# Patient Record
Sex: Male | Born: 1940 | Race: White | Hispanic: No | Marital: Married | Smoking: Never smoker
Health system: Southern US, Community
[De-identification: ages and names within clinical notes are randomized; demographics above are authoritative.]

## PROBLEM LIST (undated history)

## (undated) DIAGNOSIS — I10 Essential (primary) hypertension: Secondary | ICD-10-CM

## (undated) DIAGNOSIS — R531 Weakness: Secondary | ICD-10-CM

## (undated) DIAGNOSIS — T7840XA Allergy, unspecified, initial encounter: Secondary | ICD-10-CM

## (undated) DIAGNOSIS — R238 Other skin changes: Secondary | ICD-10-CM

## (undated) DIAGNOSIS — M2550 Pain in unspecified joint: Secondary | ICD-10-CM

## (undated) DIAGNOSIS — Z8619 Personal history of other infectious and parasitic diseases: Secondary | ICD-10-CM

## (undated) DIAGNOSIS — R112 Nausea with vomiting, unspecified: Secondary | ICD-10-CM

## (undated) DIAGNOSIS — E039 Hypothyroidism, unspecified: Secondary | ICD-10-CM

## (undated) DIAGNOSIS — M1990 Unspecified osteoarthritis, unspecified site: Secondary | ICD-10-CM

## (undated) DIAGNOSIS — K219 Gastro-esophageal reflux disease without esophagitis: Secondary | ICD-10-CM

## (undated) DIAGNOSIS — N400 Enlarged prostate without lower urinary tract symptoms: Secondary | ICD-10-CM

## (undated) DIAGNOSIS — R234 Changes in skin texture: Secondary | ICD-10-CM

## (undated) DIAGNOSIS — Z9889 Other specified postprocedural states: Secondary | ICD-10-CM

## (undated) DIAGNOSIS — R351 Nocturia: Secondary | ICD-10-CM

## (undated) DIAGNOSIS — J45909 Unspecified asthma, uncomplicated: Secondary | ICD-10-CM

## (undated) DIAGNOSIS — J189 Pneumonia, unspecified organism: Secondary | ICD-10-CM

## (undated) DIAGNOSIS — G8929 Other chronic pain: Secondary | ICD-10-CM

## (undated) DIAGNOSIS — M549 Dorsalgia, unspecified: Secondary | ICD-10-CM

## (undated) DIAGNOSIS — R233 Spontaneous ecchymoses: Secondary | ICD-10-CM

## (undated) DIAGNOSIS — Z8709 Personal history of other diseases of the respiratory system: Secondary | ICD-10-CM

## (undated) DIAGNOSIS — F419 Anxiety disorder, unspecified: Secondary | ICD-10-CM

## (undated) HISTORY — PX: JOINT REPLACEMENT: SHX530

## (undated) HISTORY — PX: OTHER SURGICAL HISTORY: SHX169

## (undated) HISTORY — PX: TONSILLECTOMY: SUR1361

## (undated) HISTORY — PX: BACK SURGERY: SHX140

## (undated) HISTORY — PX: ESOPHAGOGASTRODUODENOSCOPY: SHX1529

## (undated) HISTORY — PX: HERNIA REPAIR: SHX51

---

## 2006-01-29 DIAGNOSIS — Z8619 Personal history of other infectious and parasitic diseases: Secondary | ICD-10-CM

## 2006-01-29 HISTORY — PX: SHOULDER ARTHROSCOPY: SHX128

## 2006-01-29 HISTORY — DX: Personal history of other infectious and parasitic diseases: Z86.19

## 2012-01-30 HISTORY — PX: CARPAL TUNNEL RELEASE: SHX101

## 2013-04-22 ENCOUNTER — Other Ambulatory Visit: Payer: Self-pay | Admitting: Neurosurgery

## 2013-05-25 ENCOUNTER — Encounter (HOSPITAL_COMMUNITY): Payer: Self-pay

## 2013-05-29 ENCOUNTER — Encounter (HOSPITAL_COMMUNITY)
Admission: RE | Admit: 2013-05-29 | Discharge: 2013-05-29 | Disposition: A | Discharge status: Home / Self Care | Payer: Medicare Other | Source: Ambulatory Visit | Attending: Neurosurgery | Admitting: Neurosurgery

## 2013-05-29 ENCOUNTER — Encounter (HOSPITAL_COMMUNITY): Payer: Self-pay

## 2013-05-29 DIAGNOSIS — I10 Essential (primary) hypertension: Secondary | ICD-10-CM | POA: Insufficient documentation

## 2013-05-29 DIAGNOSIS — Z01812 Encounter for preprocedural laboratory examination: Secondary | ICD-10-CM | POA: Insufficient documentation

## 2013-05-29 DIAGNOSIS — Z01818 Encounter for other preprocedural examination: Secondary | ICD-10-CM | POA: Insufficient documentation

## 2013-05-29 HISTORY — DX: Unspecified osteoarthritis, unspecified site: M19.90

## 2013-05-29 HISTORY — DX: Unspecified asthma, uncomplicated: J45.909

## 2013-05-29 HISTORY — DX: Gastro-esophageal reflux disease without esophagitis: K21.9

## 2013-05-29 HISTORY — DX: Essential (primary) hypertension: I10

## 2013-05-29 HISTORY — DX: Anxiety disorder, unspecified: F41.9

## 2013-05-29 LAB — BASIC METABOLIC PANEL
BUN: 18 mg/dL (ref 6–23)
CALCIUM: 9.3 mg/dL (ref 8.4–10.5)
CO2: 27 mEq/L (ref 19–32)
Chloride: 98 mEq/L (ref 96–112)
Creatinine, Ser: 1.41 mg/dL — ABNORMAL HIGH (ref 0.50–1.35)
GFR calc Af Amer: 55 mL/min — ABNORMAL LOW (ref >90)
GFR calc non Af Amer: 48 mL/min — ABNORMAL LOW (ref >90)
GLUCOSE: 104 mg/dL — AB (ref 70–99)
Potassium: 4.4 mEq/L (ref 3.7–5.3)
Sodium: 136 mEq/L — ABNORMAL LOW (ref 137–147)

## 2013-05-29 LAB — CBC
HCT: 40 % (ref 39.0–52.0)
HEMOGLOBIN: 13.6 g/dL (ref 13.0–17.0)
MCH: 30.2 pg (ref 26.0–34.0)
MCHC: 34 g/dL (ref 30.0–36.0)
MCV: 88.7 fL (ref 78.0–100.0)
Platelets: 348 10*3/uL (ref 150–400)
RBC: 4.51 MIL/uL (ref 4.22–5.81)
RDW: 14 % (ref 11.5–15.5)
WBC: 9.8 10*3/uL (ref 4.0–10.5)

## 2013-05-29 LAB — SURGICAL PCR SCREEN
MRSA, PCR: NEGATIVE
Staphylococcus aureus: NEGATIVE

## 2013-05-29 NOTE — Progress Notes (Signed)
Pt. Reports that had numerous studies & reviews because of SOB a couple yrs. Ago.  Pulm. Testing, sleep study, cardio review,; no identity made for diagnosis. Conclusion made that he might have anxiety; pt. Started on antianxiety med & has used 3 times per day every day since.  Pt. Denies SOB, chest symptoms today.

## 2013-05-29 NOTE — Progress Notes (Signed)
05/29/13 1327  OBSTRUCTIVE SLEEP APNEA  Have you ever been diagnosed with sleep apnea through a sleep study? Yes  If yes, do you have and use a CPAP or BPAP machine every night? 0 (tried using CPAP /w O2, not able due to nasal congestion )  Do you snore loudly (loud enough to be heard through closed doors)?  0  Do you often feel tired, fatigued, or sleepy during the daytime? 0  Has anyone observed you stop breathing during your sleep? 1  Do you have, or are you being treated for high blood pressure? 1  BMI more than 35 kg/m2? 0  Age over 74 years old? 1  Neck circumference greater than 40 cm/16 inches? 1  Gender: 1  Obstructive Sleep Apnea Score 5  Score 4 or greater  Results sent to PCP

## 2013-05-29 NOTE — Progress Notes (Signed)
Call to Dr. Garfield CorneaValasquez's office  [phone]( [phone])for fax no. To send sleep screen tool, office is currently closed, will try again on Mon. 06/01/2013

## 2013-05-29 NOTE — Pre-Procedure Instructions (Signed)
William Rich  05/29/2013   Your procedure is scheduled on:  Monday, May 11th  [facility]30 AM.  Call this number if you have problems the morning of surgery: [phone]   Remember:   Do not eat food or drink liquids after midnight.   Take these medicines the morning of surgery with A SIP OF WATER: pepcid, zyrtec, flonase, synthroid, tylenol, xopenex if needed  Stop taking aspirin, over the counter vitamins/herbal medications, NSAIDS (ibuprofen, advil, naproxen) 7 days prior to surgery   Do not wear jewelry.  Do not wear lotions, powders, or perfumes. You may wear deodorant.  Do not shave 48 hours prior to surgery. Men may shave face and neck.  Do not bring valuables to the hospital.  [name] is not responsible  for any belongings or valuables.               Contacts, dentures or bridgework may not be worn into surgery.  Leave suitcase in the car. After surgery it may be brought to your room.  For patients admitted to the hospital, discharge time is determined by your  treatment team.               Patients discharged the day of surgery will not be allowed to drive home.  Please read over the following fact sheets that you were given: Pain Booklet, Coughing and Deep Breathing, MRSA Information and Surgical Site Infection Prevention [place] - Preparing for Surgery  Before surgery, you can play an important role.  Because skin is not sterile, your skin needs to be as free of germs as possible.  You can reduce the number of germs on you skin by washing with CHG (chlorahexidine gluconate) soap before surgery.  CHG is an antiseptic cleaner which kills germs and bonds with the skin to continue killing germs even after washing.  Please DO NOT use if you have an allergy to CHG or antibacterial soaps.  If your skin becomes reddened/irritated stop using the CHG and inform your nurse when you arrive at Short Stay.  Do not shave (including legs  and underarms) for at least 48 hours prior to the first CHG shower.  You may shave your face.  Please follow these instructions carefully:   1.  Shower with CHG Soap the night before surgery and the morning of Surgery.  2.  If you choose to wash your hair, wash your hair first as usual with your normal shampoo.  3.  After you shampoo, rinse your hair and body thoroughly to remove the shampoo.  4.  Use CHG as you would any other liquid soap.  You can apply CHG directly to the skin and wash gently with scrungie or a clean washcloth.  5.  Apply the CHG Soap to your body ONLY FROM THE NECK DOWN.  Do not use on open wounds or open sores.  Avoid contact with your eyes, ears, mouth and genitals (private parts).  Wash genitals (private parts) with your normal soap.  6.  Wash thoroughly, paying special attention to the area where your surgery will be performed.  7.  Thoroughly rinse your body with warm water from the neck down.  8.  DO NOT shower/wash with your normal soap after using and rinsing off the CHG Soap.  9.  Pat yourself dry with a clean towel.            10.  Wear clean pajamas.  11.  Place clean sheets on your bed the night of your first shower and do not sleep with pets.  Day of Surgery  Do not apply any lotions/deoderants the morning of surgery.  Please wear clean clothes to the hospital/surgery center.

## 2013-05-29 NOTE — Progress Notes (Signed)
Pt. Reports that he had cardiac review prior to last surgery (carpal tunnel surgery) /w [facility]. 2D ECHO, EKG, notes requested from Roxan DieselLisa T MD office, sent faxed cover sheet /w [facility].

## 2013-06-08 ENCOUNTER — Encounter (HOSPITAL_COMMUNITY): Admission: RE | Payer: Self-pay | Source: Ambulatory Visit

## 2013-06-08 ENCOUNTER — Inpatient Hospital Stay (HOSPITAL_COMMUNITY): Admission: RE | Admit: 2013-06-08 | Payer: Medicare Other | Source: Ambulatory Visit | Admitting: Neurosurgery

## 2013-06-08 SURGERY — POSTERIOR CERVICAL FUSION/FORAMINOTOMY LEVEL 4
Anesthesia: General

## 2015-08-12 IMAGING — CR DG CHEST 2V
2 series · 2 of 2 positions shown · non-contrast
Comparison: June 01, 2010

CLINICAL DATA: Preoperative cervical region surgery; hypertension

EXAM:
CHEST  2 VIEW

[w chest pa]
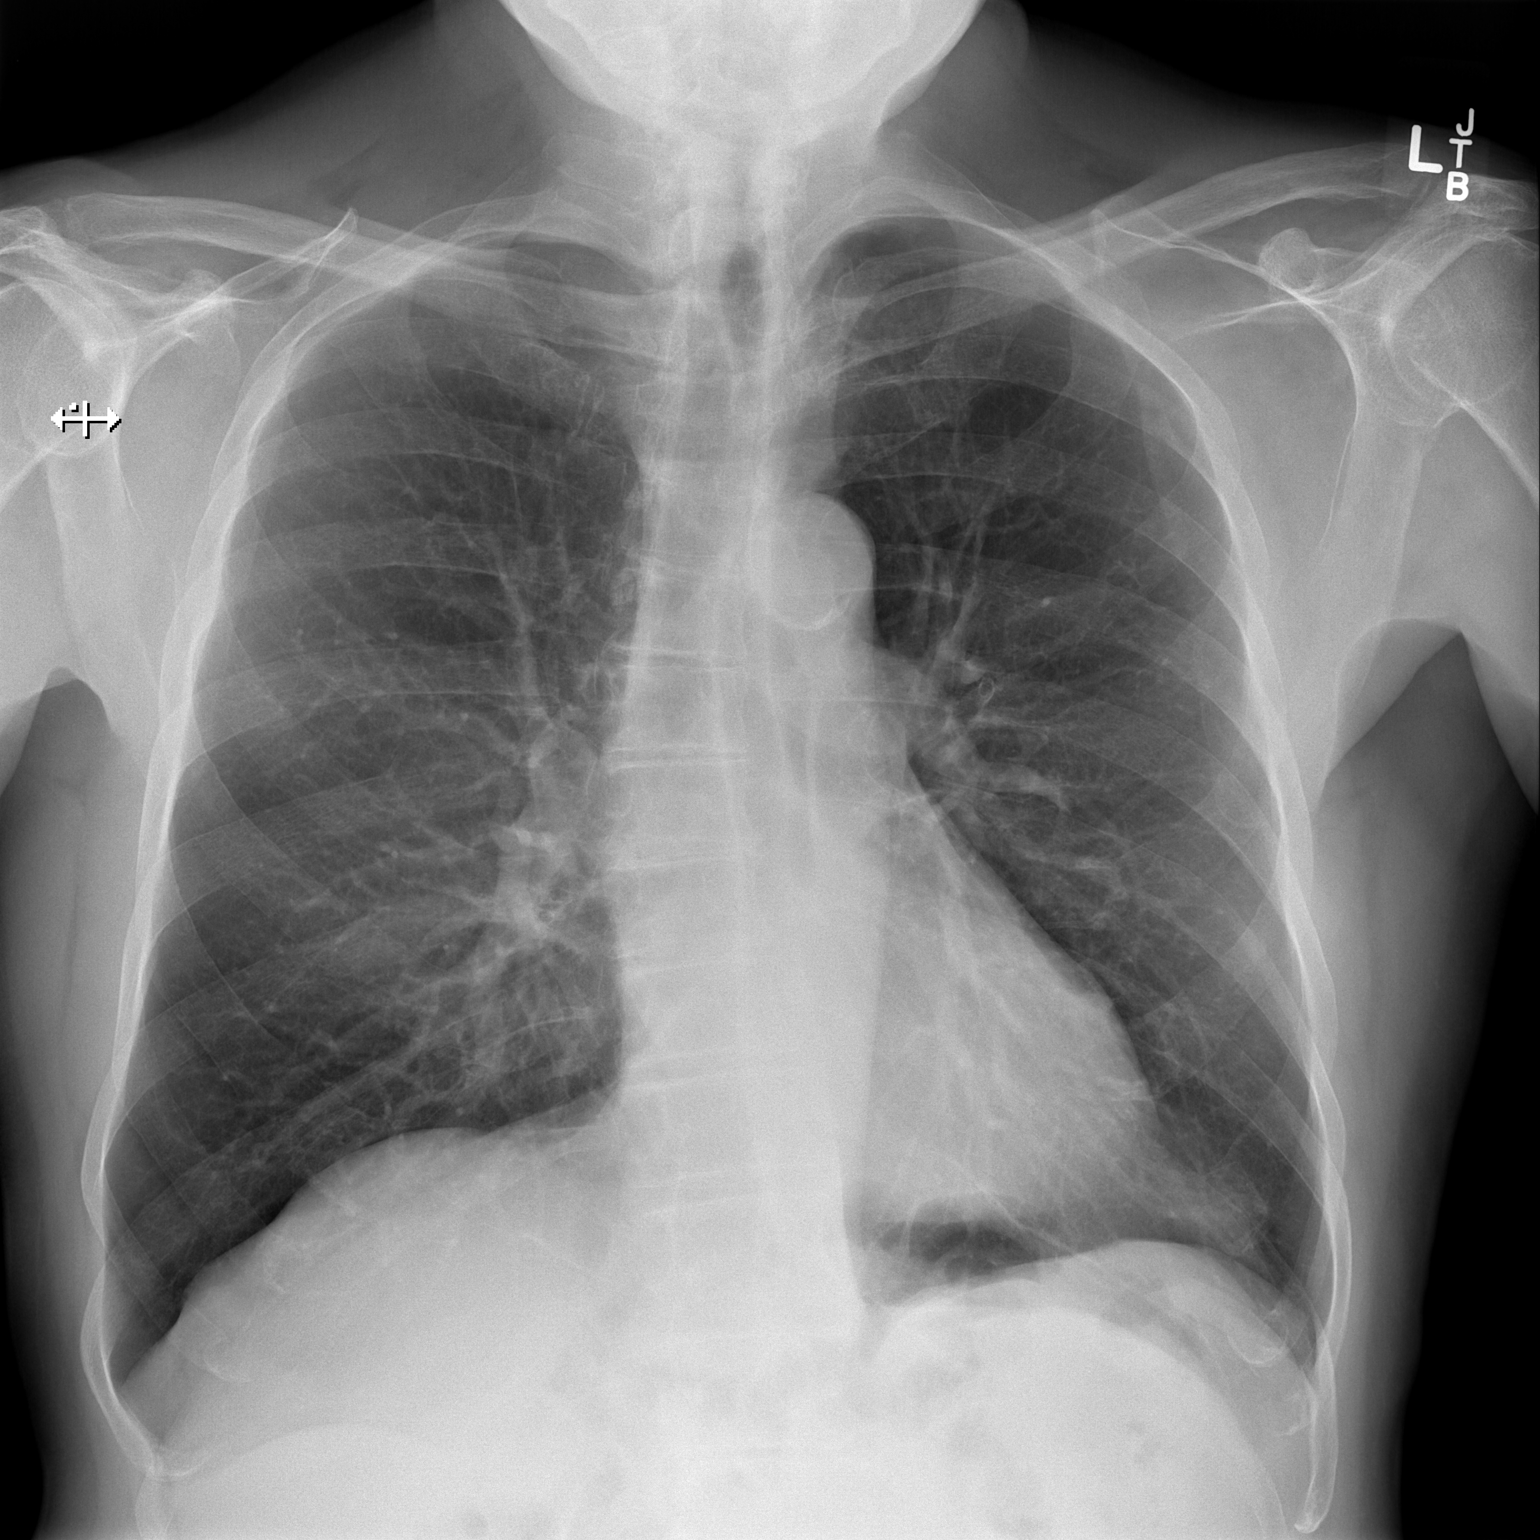

[w chest lat]
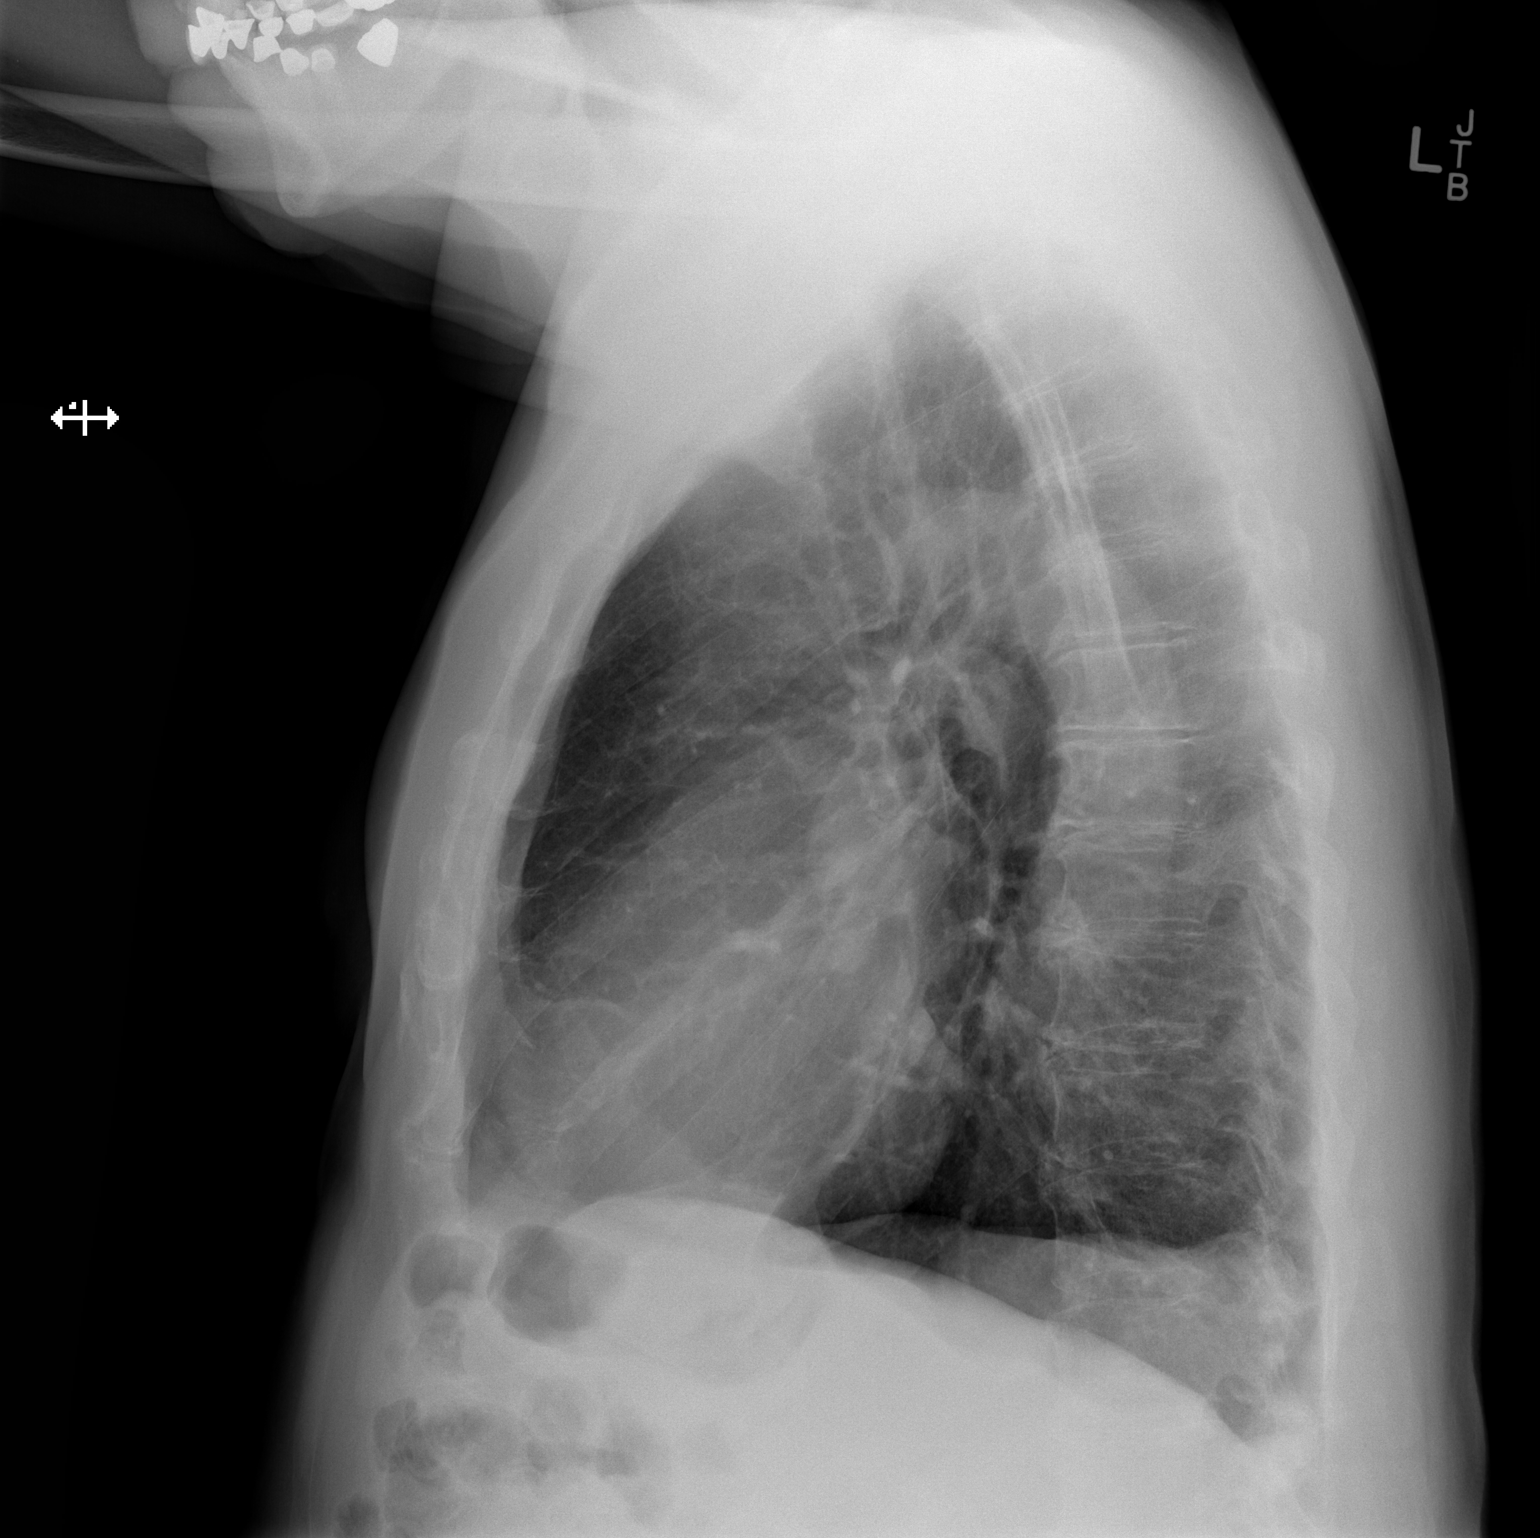

[2 of 2 positions shown; findings below may reference images not displayed]

FINDINGS: There is no edema or consolidation. Heart size and pulmonary
vascularity are normal. No adenopathy. There is atherosclerotic
change in the aorta. No adenopathy. There is scoliosis with
degenerative change in the thoracic spine.
IMPRESSION: No edema or consolidation.

## 2016-01-03 ENCOUNTER — Other Ambulatory Visit: Payer: Self-pay | Admitting: Neurosurgery

## 2016-01-25 ENCOUNTER — Encounter (HOSPITAL_COMMUNITY)
Admission: RE | Admit: 2016-01-25 | Discharge: 2016-01-25 | Disposition: A | Discharge status: Home / Self Care | Payer: Medicare Other | Source: Ambulatory Visit | Attending: Neurosurgery | Admitting: Neurosurgery

## 2016-01-25 ENCOUNTER — Encounter (HOSPITAL_COMMUNITY): Payer: Self-pay

## 2016-01-25 DIAGNOSIS — Z01812 Encounter for preprocedural laboratory examination: Secondary | ICD-10-CM | POA: Insufficient documentation

## 2016-01-25 DIAGNOSIS — M48061 Spinal stenosis, lumbar region without neurogenic claudication: Secondary | ICD-10-CM | POA: Insufficient documentation

## 2016-01-25 HISTORY — DX: Allergy, unspecified, initial encounter: T78.40XA

## 2016-01-25 HISTORY — DX: Changes in skin texture: R23.4

## 2016-01-25 HISTORY — DX: Weakness: R53.1

## 2016-01-25 HISTORY — DX: Other specified postprocedural states: Z98.890

## 2016-01-25 HISTORY — DX: Personal history of other infectious and parasitic diseases: Z86.19

## 2016-01-25 HISTORY — DX: Other specified postprocedural states: R11.2

## 2016-01-25 HISTORY — DX: Nocturia: R35.1

## 2016-01-25 HISTORY — DX: Personal history of other diseases of the respiratory system: Z87.09

## 2016-01-25 HISTORY — DX: Hypothyroidism, unspecified: E03.9

## 2016-01-25 HISTORY — DX: Pain in unspecified joint: M25.50

## 2016-01-25 HISTORY — DX: Dorsalgia, unspecified: M54.9

## 2016-01-25 HISTORY — DX: Other chronic pain: G89.29

## 2016-01-25 HISTORY — DX: Pneumonia, unspecified organism: J18.9

## 2016-01-25 HISTORY — DX: Benign prostatic hyperplasia without lower urinary tract symptoms: N40.0

## 2016-01-25 HISTORY — DX: Spontaneous ecchymoses: R23.3

## 2016-01-25 HISTORY — DX: Other skin changes: R23.8

## 2016-01-25 LAB — BASIC METABOLIC PANEL
ANION GAP: 6 (ref 5–15)
BUN: 17 mg/dL (ref 6–20)
CHLORIDE: 98 mmol/L — AB (ref 101–111)
CO2: 31 mmol/L (ref 22–32)
Calcium: 8.7 mg/dL — ABNORMAL LOW (ref 8.9–10.3)
Creatinine, Ser: 1.47 mg/dL — ABNORMAL HIGH (ref 0.61–1.24)
GFR calc Af Amer: 52 mL/min — ABNORMAL LOW (ref >60)
GFR calc non Af Amer: 45 mL/min — ABNORMAL LOW (ref >60)
Glucose, Bld: 84 mg/dL (ref 65–99)
Potassium: 4 mmol/L (ref 3.5–5.1)
Sodium: 135 mmol/L (ref 135–145)

## 2016-01-25 LAB — CBC
HEMATOCRIT: 38.9 % — AB (ref 39.0–52.0)
HEMOGLOBIN: 12.7 g/dL — AB (ref 13.0–17.0)
MCH: 28.4 pg (ref 26.0–34.0)
MCHC: 32.6 g/dL (ref 30.0–36.0)
MCV: 87 fL (ref 78.0–100.0)
Platelets: 319 10*3/uL (ref 150–400)
RBC: 4.47 MIL/uL (ref 4.22–5.81)
RDW: 14.2 % (ref 11.5–15.5)
WBC: 7.1 10*3/uL (ref 4.0–10.5)

## 2016-01-25 LAB — SURGICAL PCR SCREEN
MRSA, PCR: NEGATIVE
Staphylococcus aureus: NEGATIVE

## 2016-01-25 MED ORDER — CHLORHEXIDINE GLUCONATE CLOTH 2 % EX PADS: 6.0000 | MEDICATED_PAD | Freq: Once | CUTANEOUS | Status: DC

## 2016-01-25 NOTE — Pre-Procedure Instructions (Signed)
William Rich  01/25/2016      [facility] - [place], [place] - [address] [place] [id number] Phone: [phone] Fax: [phone]  [facility] - [place], [place] - [address] AT [facility] [address] [place] [id number] Phone: [phone] Fax: [phone]    Your procedure is scheduled on Wed, Jan 3 @ 1:40 PM  [facility] at 11:40 AM  Call this number if you have problems the morning of surgery:  [phone]   Remember:  Do not eat food or drink liquids after midnight.  Take these medicines the morning of surgery with A SIP OF WATER Alprazolam(Xanax),Cetirizine(Zyrtec),Pepcid(Famotidine),Flonase(Fluticasone),Gabapentin(Neurontin),Xopenex if needed<Bring Your Inhaler With You>,and Tramadol(Ultram-if needed)              No Goody's,BC's,Aleve,Advil,Motrin,Ibuprofen,Fish Oil,or any Herbal Medications.   Do not wear jewelry.  Do not wear lotions, powders,colognes or deoderant.  Men may shave face and neck.  Do not bring valuables to the hospital.  [facility] is not responsible for any belongings or valuables.  Contacts, dentures or bridgework may not be worn into surgery.  Leave your suitcase in the car.  After surgery it may be brought to your room.  For patients admitted to the hospital, discharge time will be determined by your treatment team.  Patients discharged the day of surgery will not be allowed to drive home.    [facility]  Before surgery, you can play an important role.  Because skin is not sterile, your skin needs to be as free of germs as possible.  You can reduce the number of germs on you skin by washing with CHG (chlorahexidine gluconate) soap before surgery.  CHG is an antiseptic cleaner which kills germs and bonds with the skin to continue killing germs even after washing.  Please DO NOT  use if you have an allergy to CHG or antibacterial soaps.  If your skin becomes reddened/irritated stop using the CHG and inform your nurse when you arrive at Short Stay.  Do not shave (including legs and underarms) for at least 48 hours prior to the first CHG shower.  You may shave your face.  Please follow these instructions carefully:   1.  Shower with CHG Soap the night before surgery and the                                morning of Surgery.  2.  If you choose to wash your hair, wash your hair first as usual with your       normal shampoo.  3.  After you shampoo, rinse your hair and body thoroughly to remove the                      Shampoo.  4.  Use CHG as you would any other liquid soap.  You can apply chg directly       to the skin and wash gently with scrungie or a clean washcloth.  5.  Apply the CHG Soap to your body ONLY FROM THE NECK DOWN.        Do not use on open wounds or open sores.  Avoid contact with your eyes,       ears, mouth and genitals (private  parts).  Wash genitals (private parts)       with your normal soap.  6.  Wash thoroughly, paying special attention to the area where your surgery        will be performed.  7.  Thoroughly rinse your body with warm water from the neck down.  8.  DO NOT shower/wash with your normal soap after using and rinsing off       the CHG Soap.  9.  Pat yourself dry with a clean towel.            10.  Wear clean pajamas.            11.  Place clean sheets on your bed the night of your first shower and do not        sleep with pets.  Day of Surgery  Do not apply any lotions/deoderants the morning of surgery.  Please wear clean clothes to the hospital/surgery center.    Please read over the following fact sheets that you were given. Pain Booklet, Coughing and Deep Breathing, MRSA Information and Surgical Site Infection Prevention

## 2016-01-25 NOTE — Progress Notes (Addendum)
Cardiologist denies  Medical Md is Dr.Gretchen Velazquez  Echo > 10 yrs ago d/t breathing problems to rule out everything  Stress test > 10 yrs ago d/t breathing problems to rule out everything  Heart cath denies  [facility]   [facility]

## 2016-01-26 NOTE — Progress Notes (Signed)
Re-requested EKG from [facility] that was done on 09/12/15

## 2016-01-27 NOTE — Progress Notes (Signed)
Call [facility] left a voicemail reporting the EKG done on 09/12/2015, to please fax the copy of the tracing [facility]. Giving both fax nos. [phone] or [phone]

## 2016-02-01 ENCOUNTER — Encounter (HOSPITAL_COMMUNITY): Payer: Self-pay | Admitting: Anesthesiology

## 2016-02-01 ENCOUNTER — Inpatient Hospital Stay (HOSPITAL_COMMUNITY): Payer: Medicare Other | Admitting: Anesthesiology

## 2016-02-01 ENCOUNTER — Encounter (HOSPITAL_COMMUNITY)
Admission: RE | Disposition: A | Discharge status: Home / Self Care | Payer: Self-pay | Source: Ambulatory Visit | Attending: Neurosurgery

## 2016-02-01 ENCOUNTER — Inpatient Hospital Stay (HOSPITAL_COMMUNITY): Payer: Medicare Other

## 2016-02-01 ENCOUNTER — Inpatient Hospital Stay (HOSPITAL_COMMUNITY)
Admission: RE | Admit: 2016-02-01 | Discharge: 2016-02-03 | DRG: 517 | Disposition: A | Discharge status: Home / Self Care | Payer: Medicare Other | Source: Ambulatory Visit | Attending: Neurosurgery | Admitting: Neurosurgery

## 2016-02-01 DIAGNOSIS — Z91048 Other nonmedicinal substance allergy status: Secondary | ICD-10-CM

## 2016-02-01 DIAGNOSIS — J45909 Unspecified asthma, uncomplicated: Secondary | ICD-10-CM | POA: Diagnosis present

## 2016-02-01 DIAGNOSIS — I10 Essential (primary) hypertension: Secondary | ICD-10-CM | POA: Diagnosis present

## 2016-02-01 DIAGNOSIS — Z96653 Presence of artificial knee joint, bilateral: Secondary | ICD-10-CM | POA: Diagnosis present

## 2016-02-01 DIAGNOSIS — Z79899 Other long term (current) drug therapy: Secondary | ICD-10-CM

## 2016-02-01 DIAGNOSIS — Z888 Allergy status to other drugs, medicaments and biological substances status: Secondary | ICD-10-CM

## 2016-02-01 DIAGNOSIS — Z419 Encounter for procedure for purposes other than remedying health state, unspecified: Secondary | ICD-10-CM

## 2016-02-01 DIAGNOSIS — M48061 Spinal stenosis, lumbar region without neurogenic claudication: Secondary | ICD-10-CM | POA: Diagnosis present

## 2016-02-01 DIAGNOSIS — M549 Dorsalgia, unspecified: Secondary | ICD-10-CM | POA: Diagnosis present

## 2016-02-01 DIAGNOSIS — M48062 Spinal stenosis, lumbar region with neurogenic claudication: Principal | ICD-10-CM | POA: Diagnosis present

## 2016-02-01 DIAGNOSIS — Z882 Allergy status to sulfonamides status: Secondary | ICD-10-CM | POA: Diagnosis not present

## 2016-02-01 DIAGNOSIS — E039 Hypothyroidism, unspecified: Secondary | ICD-10-CM | POA: Diagnosis present

## 2016-02-01 DIAGNOSIS — Z885 Allergy status to narcotic agent status: Secondary | ICD-10-CM

## 2016-02-01 DIAGNOSIS — F419 Anxiety disorder, unspecified: Secondary | ICD-10-CM | POA: Diagnosis present

## 2016-02-01 DIAGNOSIS — K219 Gastro-esophageal reflux disease without esophagitis: Secondary | ICD-10-CM | POA: Diagnosis present

## 2016-02-01 HISTORY — PX: LUMBAR LAMINECTOMY/DECOMPRESSION MICRODISCECTOMY: SHX5026

## 2016-02-01 SURGERY — LUMBAR LAMINECTOMY/DECOMPRESSION MICRODISCECTOMY 3 LEVELS
Anesthesia: General | Site: Back

## 2016-02-01 MED ORDER — IRBESARTAN 150 MG PO TABS
150.0000 mg | ORAL_TABLET | Freq: Every day | ORAL | Status: DC
  Administered 2016-02-02 – 2016-02-03 (×2): 150 mg via ORAL
  Filled 2016-02-01 (×2): qty 1

## 2016-02-01 MED ORDER — SODIUM CHLORIDE 0.9 % IR SOLN
Status: DC | PRN
  Administered 2016-02-01: 15:00:00

## 2016-02-01 MED ORDER — PROMETHAZINE HCL 25 MG/ML IJ SOLN
12.5000 mg | Freq: Four times a day (QID) | INTRAMUSCULAR | Status: DC | PRN
  Administered 2016-02-01: 12.5 mg via INTRAVENOUS
  Filled 2016-02-01: qty 1

## 2016-02-01 MED ORDER — ROCURONIUM BROMIDE 50 MG/5ML IV SOSY
PREFILLED_SYRINGE | INTRAVENOUS | Status: AC
  Filled 2016-02-01: qty 5

## 2016-02-01 MED ORDER — OXYCODONE-ACETAMINOPHEN 5-325 MG PO TABS
1.0000 | ORAL_TABLET | ORAL | Status: DC | PRN
  Administered 2016-02-01: 1 via ORAL
  Administered 2016-02-01 – 2016-02-02 (×3): 2 via ORAL
  Administered 2016-02-02: 1 via ORAL
  Administered 2016-02-02: 2 via ORAL
  Administered 2016-02-02: 1 via ORAL
  Administered 2016-02-03 (×3): 2 via ORAL
  Filled 2016-02-01: qty 2
  Filled 2016-02-01: qty 1
  Filled 2016-02-01 (×3): qty 2
  Filled 2016-02-01: qty 1
  Filled 2016-02-01 (×2): qty 2
  Filled 2016-02-01: qty 1

## 2016-02-01 MED ORDER — LACTATED RINGERS IV SOLN
INTRAVENOUS | Status: DC
  Administered 2016-02-01 (×2): via INTRAVENOUS

## 2016-02-01 MED ORDER — LIDOCAINE-EPINEPHRINE (PF) 2 %-1:200000 IJ SOLN
INTRAMUSCULAR | Status: DC | PRN
  Administered 2016-02-01: 10 mL via INTRADERMAL
  Administered 2016-02-01: 5 mL via INTRADERMAL

## 2016-02-01 MED ORDER — THROMBIN 5000 UNITS EX SOLR
CUTANEOUS | Status: DC | PRN
  Administered 2016-02-01: 17:00:00 via TOPICAL

## 2016-02-01 MED ORDER — SODIUM CHLORIDE 0.9 % IV SOLN: 250.0000 mL | INTRAVENOUS | Status: DC

## 2016-02-01 MED ORDER — MIDAZOLAM HCL 2 MG/2ML IJ SOLN
INTRAMUSCULAR | Status: AC
  Filled 2016-02-01: qty 2

## 2016-02-01 MED ORDER — OXYCODONE-ACETAMINOPHEN 5-325 MG PO TABS
ORAL_TABLET | ORAL | Status: AC
  Administered 2016-02-01: 2 via ORAL
  Filled 2016-02-01: qty 2

## 2016-02-01 MED ORDER — ONDANSETRON HCL 4 MG/2ML IJ SOLN
4.0000 mg | INTRAMUSCULAR | Status: DC | PRN
  Filled 2016-02-01: qty 2

## 2016-02-01 MED ORDER — ACETAMINOPHEN 650 MG RE SUPP: 650.0000 mg | RECTAL | Status: DC | PRN

## 2016-02-01 MED ORDER — CEFAZOLIN SODIUM-DEXTROSE 2-4 GM/100ML-% IV SOLN
2.0000 g | Freq: Three times a day (TID) | INTRAVENOUS | Status: AC
  Administered 2016-02-01 – 2016-02-02 (×2): 2 g via INTRAVENOUS
  Filled 2016-02-01 (×2): qty 100

## 2016-02-01 MED ORDER — SODIUM CHLORIDE 0.9% FLUSH
3.0000 mL | Freq: Two times a day (BID) | INTRAVENOUS | Status: DC
  Administered 2016-02-02: 3 mL via INTRAVENOUS

## 2016-02-01 MED ORDER — LIDOCAINE-EPINEPHRINE (PF) 2 %-1:200000 IJ SOLN
INTRAMUSCULAR | Status: AC
  Filled 2016-02-01: qty 20

## 2016-02-01 MED ORDER — ACETAMINOPHEN 325 MG PO TABS: 650.0000 mg | ORAL_TABLET | ORAL | Status: DC | PRN

## 2016-02-01 MED ORDER — SUCCINYLCHOLINE CHLORIDE 20 MG/ML IJ SOLN
INTRAMUSCULAR | Status: DC | PRN
  Administered 2016-02-01: 100 mg via INTRAVENOUS

## 2016-02-01 MED ORDER — FENTANYL CITRATE (PF) 100 MCG/2ML IJ SOLN
INTRAMUSCULAR | Status: AC
  Filled 2016-02-01: qty 4

## 2016-02-01 MED ORDER — PROPOFOL 10 MG/ML IV BOLUS
INTRAVENOUS | Status: AC
  Filled 2016-02-01: qty 20

## 2016-02-01 MED ORDER — LEVOTHYROXINE SODIUM 100 MCG PO TABS
50.0000 ug | ORAL_TABLET | Freq: Every day | ORAL | Status: DC
  Administered 2016-02-02 – 2016-02-03 (×2): 50 ug via ORAL
  Filled 2016-02-01 (×2): qty 1

## 2016-02-01 MED ORDER — BUPIVACAINE HCL (PF) 0.25 % IJ SOLN
INTRAMUSCULAR | Status: AC
  Filled 2016-02-01: qty 30

## 2016-02-01 MED ORDER — PROPOFOL 10 MG/ML IV BOLUS
INTRAVENOUS | Status: DC | PRN
  Administered 2016-02-01: 170 mg via INTRAVENOUS

## 2016-02-01 MED ORDER — DEXAMETHASONE SODIUM PHOSPHATE 10 MG/ML IJ SOLN
10.0000 mg | INTRAMUSCULAR | Status: AC
  Administered 2016-02-01: 10 mg via INTRAVENOUS

## 2016-02-01 MED ORDER — ALUM & MAG HYDROXIDE-SIMETH 200-200-20 MG/5ML PO SUSP: 30.0000 mL | Freq: Four times a day (QID) | ORAL | Status: DC | PRN

## 2016-02-01 MED ORDER — MIDAZOLAM HCL 5 MG/5ML IJ SOLN
INTRAMUSCULAR | Status: DC | PRN
  Administered 2016-02-01 (×2): 1 mg via INTRAVENOUS

## 2016-02-01 MED ORDER — CEFAZOLIN SODIUM-DEXTROSE 2-3 GM-% IV SOLR
2.0000 g | Freq: Once | INTRAVENOUS | Status: AC
  Administered 2016-02-01: 2 g via INTRAVENOUS

## 2016-02-01 MED ORDER — PHENOL 1.4 % MT LIQD: 1.0000 | OROMUCOSAL | Status: DC | PRN

## 2016-02-01 MED ORDER — PANTOPRAZOLE SODIUM 40 MG PO TBEC
40.0000 mg | DELAYED_RELEASE_TABLET | Freq: Every day | ORAL | Status: DC
  Filled 2016-02-01: qty 1

## 2016-02-01 MED ORDER — MENTHOL 3 MG MT LOZG: 1.0000 | LOZENGE | OROMUCOSAL | Status: DC | PRN

## 2016-02-01 MED ORDER — HYDROMORPHONE HCL 1 MG/ML IJ SOLN
0.2500 mg | INTRAMUSCULAR | Status: DC | PRN
  Administered 2016-02-01: 0.5 mg via INTRAVENOUS

## 2016-02-01 MED ORDER — GABAPENTIN 100 MG PO CAPS
200.0000 mg | ORAL_CAPSULE | Freq: Three times a day (TID) | ORAL | Status: DC
  Administered 2016-02-01 – 2016-02-03 (×5): 200 mg via ORAL
  Filled 2016-02-01 (×5): qty 2

## 2016-02-01 MED ORDER — SURGIFOAM 100 EX MISC
CUTANEOUS | Status: DC | PRN
  Administered 2016-02-01 (×2): via TOPICAL

## 2016-02-01 MED ORDER — HYDROCHLOROTHIAZIDE 12.5 MG PO CAPS
12.5000 mg | ORAL_CAPSULE | Freq: Every day | ORAL | Status: DC
  Administered 2016-02-02 – 2016-02-03 (×2): 12.5 mg via ORAL
  Filled 2016-02-01 (×2): qty 1

## 2016-02-01 MED ORDER — 0.9 % SODIUM CHLORIDE (POUR BTL) OPTIME
TOPICAL | Status: DC | PRN
  Administered 2016-02-01: 1000 mL

## 2016-02-01 MED ORDER — MONTELUKAST SODIUM 10 MG PO TABS
10.0000 mg | ORAL_TABLET | Freq: Every day | ORAL | Status: DC
  Administered 2016-02-01 – 2016-02-02 (×2): 10 mg via ORAL
  Filled 2016-02-01 (×3): qty 1

## 2016-02-01 MED ORDER — THROMBIN 5000 UNITS EX SOLR
CUTANEOUS | Status: AC
  Filled 2016-02-01: qty 5000

## 2016-02-01 MED ORDER — THROMBIN 20000 UNITS EX SOLR
CUTANEOUS | Status: AC
  Filled 2016-02-01: qty 20000

## 2016-02-01 MED ORDER — HYDROMORPHONE HCL 1 MG/ML IJ SOLN: 0.5000 mg | INTRAMUSCULAR | Status: DC | PRN

## 2016-02-01 MED ORDER — FAMOTIDINE 20 MG PO TABS
20.0000 mg | ORAL_TABLET | Freq: Two times a day (BID) | ORAL | Status: DC
  Administered 2016-02-01 – 2016-02-03 (×4): 20 mg via ORAL
  Filled 2016-02-01 (×4): qty 1

## 2016-02-01 MED ORDER — PROMETHAZINE HCL 25 MG/ML IJ SOLN: 6.2500 mg | INTRAMUSCULAR | Status: DC | PRN

## 2016-02-01 MED ORDER — VANCOMYCIN HCL IN DEXTROSE 1-5 GM/200ML-% IV SOLN
INTRAVENOUS | Status: AC
  Filled 2016-02-01: qty 200

## 2016-02-01 MED ORDER — LIDOCAINE HCL (CARDIAC) 20 MG/ML IV SOLN
INTRAVENOUS | Status: DC | PRN
  Administered 2016-02-01: 70 mg via INTRAVENOUS

## 2016-02-01 MED ORDER — SUGAMMADEX SODIUM 200 MG/2ML IV SOLN
INTRAVENOUS | Status: AC
  Filled 2016-02-01: qty 2

## 2016-02-01 MED ORDER — LEVALBUTEROL TARTRATE 45 MCG/ACT IN AERO: 1.0000 | INHALATION_SPRAY | RESPIRATORY_TRACT | Status: DC | PRN

## 2016-02-01 MED ORDER — ROCURONIUM BROMIDE 100 MG/10ML IV SOLN
INTRAVENOUS | Status: DC | PRN
  Administered 2016-02-01: 10 mg via INTRAVENOUS
  Administered 2016-02-01: 50 mg via INTRAVENOUS
  Administered 2016-02-01: 10 mg via INTRAVENOUS

## 2016-02-01 MED ORDER — ALPRAZOLAM 0.5 MG PO TABS
0.5000 mg | ORAL_TABLET | Freq: Three times a day (TID) | ORAL | Status: DC
  Administered 2016-02-01 – 2016-02-03 (×5): 0.5 mg via ORAL
  Filled 2016-02-01 (×5): qty 1

## 2016-02-01 MED ORDER — SUGAMMADEX SODIUM 200 MG/2ML IV SOLN
INTRAVENOUS | Status: DC | PRN
  Administered 2016-02-01: 200 mg via INTRAVENOUS

## 2016-02-01 MED ORDER — CYCLOBENZAPRINE HCL 10 MG PO TABS: 10.0000 mg | ORAL_TABLET | Freq: Three times a day (TID) | ORAL | Status: DC | PRN

## 2016-02-01 MED ORDER — LIDOCAINE 2% (20 MG/ML) 5 ML SYRINGE
INTRAMUSCULAR | Status: AC
  Filled 2016-02-01: qty 5

## 2016-02-01 MED ORDER — PHENYLEPHRINE HCL 10 MG/ML IJ SOLN
INTRAVENOUS | Status: DC | PRN
  Administered 2016-02-01: 50 ug/min via INTRAVENOUS

## 2016-02-01 MED ORDER — TRAMADOL HCL 50 MG PO TABS: 50.0000 mg | ORAL_TABLET | Freq: Three times a day (TID) | ORAL | Status: DC

## 2016-02-01 MED ORDER — IRBESARTAN-HYDROCHLOROTHIAZIDE 150-12.5 MG PO TABS: 0.5000 | ORAL_TABLET | Freq: Every day | ORAL | Status: DC

## 2016-02-01 MED ORDER — VANCOMYCIN HCL IN DEXTROSE 1-5 GM/200ML-% IV SOLN
1000.0000 mg | INTRAVENOUS | Status: DC
Start: 2016-02-01 — End: 2016-02-01

## 2016-02-01 MED ORDER — BUPIVACAINE HCL (PF) 0.25 % IJ SOLN
INTRAMUSCULAR | Status: DC | PRN
  Administered 2016-02-01: 5 mL

## 2016-02-01 MED ORDER — PHENYLEPHRINE HCL 10 MG/ML IJ SOLN
INTRAMUSCULAR | Status: DC | PRN
  Administered 2016-02-01: 80 ug via INTRAVENOUS
  Administered 2016-02-01: 120 ug via INTRAVENOUS
  Administered 2016-02-01: 80 ug via INTRAVENOUS

## 2016-02-01 MED ORDER — LORATADINE 10 MG PO TABS
10.0000 mg | ORAL_TABLET | Freq: Every day | ORAL | Status: DC
  Administered 2016-02-02 – 2016-02-03 (×2): 10 mg via ORAL
  Filled 2016-02-01 (×2): qty 1

## 2016-02-01 MED ORDER — PANTOPRAZOLE SODIUM 40 MG IV SOLR: 40.0000 mg | Freq: Every day | INTRAVENOUS | Status: DC

## 2016-02-01 MED ORDER — HYDROMORPHONE HCL 1 MG/ML IJ SOLN
INTRAMUSCULAR | Status: AC
  Administered 2016-02-01: 0.5 mg via INTRAVENOUS
  Filled 2016-02-01: qty 0.5

## 2016-02-01 MED ORDER — FLUTICASONE PROPIONATE 50 MCG/ACT NA SUSP
1.0000 | Freq: Every day | NASAL | Status: DC
  Administered 2016-02-02 – 2016-02-03 (×2): 1 via NASAL
  Filled 2016-02-01: qty 16

## 2016-02-01 MED ORDER — SODIUM CHLORIDE 0.9% FLUSH: 3.0000 mL | INTRAVENOUS | Status: DC | PRN

## 2016-02-01 MED ORDER — FENTANYL CITRATE (PF) 100 MCG/2ML IJ SOLN
INTRAMUSCULAR | Status: DC | PRN
  Administered 2016-02-01: 100 ug via INTRAVENOUS
  Administered 2016-02-01 (×2): 50 ug via INTRAVENOUS

## 2016-02-01 SURGICAL SUPPLY — 61 items
BAG DECANTER FOR FLEXI CONT (MISCELLANEOUS) ×3 IMPLANT
BENZOIN TINCTURE PRP APPL 2/3 (GAUZE/BANDAGES/DRESSINGS) ×3 IMPLANT
BLADE CLIPPER SURG (BLADE) IMPLANT
BLADE SURG 11 STRL SS (BLADE) ×3 IMPLANT
BUR CUTTER 7.0 ROUND (BURR) ×3 IMPLANT
BUR MATCHSTICK NEURO 3.0 LAGG (BURR) ×3 IMPLANT
BUR PRECISION FLUTE 6.0 (BURR) IMPLANT
CANISTER SUCT 3000ML PPV (MISCELLANEOUS) ×3 IMPLANT
CARTRIDGE OIL MAESTRO DRILL (MISCELLANEOUS) ×1 IMPLANT
CLOSURE WOUND 1/2 X4 (GAUZE/BANDAGES/DRESSINGS) ×2
CONT SPEC 4OZ CLIKSEAL STRL BL (MISCELLANEOUS) ×3 IMPLANT
DECANTER SPIKE VIAL GLASS SM (MISCELLANEOUS) ×3 IMPLANT
DERMABOND ADVANCED (GAUZE/BANDAGES/DRESSINGS) ×2
DERMABOND ADVANCED .7 DNX12 (GAUZE/BANDAGES/DRESSINGS) ×1 IMPLANT
DIFFUSER DRILL AIR PNEUMATIC (MISCELLANEOUS) ×3 IMPLANT
DRAPE HALF SHEET 40X57 (DRAPES) IMPLANT
DRAPE LAPAROTOMY 100X72X124 (DRAPES) ×3 IMPLANT
DRAPE MICROSCOPE LEICA (MISCELLANEOUS) ×3 IMPLANT
DRAPE POUCH INSTRU U-SHP 10X18 (DRAPES) ×3 IMPLANT
DRAPE SURG 17X23 STRL (DRAPES) ×3 IMPLANT
DRSG OPSITE 4X5.5 SM (GAUZE/BANDAGES/DRESSINGS) ×3 IMPLANT
DRSG OPSITE POSTOP 4X8 (GAUZE/BANDAGES/DRESSINGS) ×3 IMPLANT
DURAPREP 26ML APPLICATOR (WOUND CARE) ×3 IMPLANT
ELECT REM PT RETURN 9FT ADLT (ELECTROSURGICAL) ×3
ELECTRODE REM PT RTRN 9FT ADLT (ELECTROSURGICAL) ×1 IMPLANT
EVACUATOR 1/8 PVC DRAIN (DRAIN) ×3 IMPLANT
GAUZE SPONGE 4X4 12PLY STRL (GAUZE/BANDAGES/DRESSINGS) ×3 IMPLANT
GAUZE SPONGE 4X4 16PLY XRAY LF (GAUZE/BANDAGES/DRESSINGS) ×3 IMPLANT
GLOVE BIO SURGEON STRL SZ8 (GLOVE) ×9 IMPLANT
GLOVE BIOGEL PI IND STRL 7.0 (GLOVE) ×3 IMPLANT
GLOVE BIOGEL PI IND STRL 7.5 (GLOVE) ×1 IMPLANT
GLOVE BIOGEL PI INDICATOR 7.0 (GLOVE) ×6
GLOVE BIOGEL PI INDICATOR 7.5 (GLOVE) ×2
GLOVE ECLIPSE 7.5 STRL STRAW (GLOVE) IMPLANT
GLOVE EXAM NITRILE LRG STRL (GLOVE) IMPLANT
GLOVE EXAM NITRILE XL STR (GLOVE) IMPLANT
GLOVE EXAM NITRILE XS STR PU (GLOVE) IMPLANT
GLOVE INDICATOR 8.5 STRL (GLOVE) ×3 IMPLANT
GOWN STRL REUS W/ TWL LRG LVL3 (GOWN DISPOSABLE) IMPLANT
GOWN STRL REUS W/ TWL XL LVL3 (GOWN DISPOSABLE) ×3 IMPLANT
GOWN STRL REUS W/TWL 2XL LVL3 (GOWN DISPOSABLE) IMPLANT
GOWN STRL REUS W/TWL LRG LVL3 (GOWN DISPOSABLE)
GOWN STRL REUS W/TWL XL LVL3 (GOWN DISPOSABLE) ×6
HEMOSTAT POWDER KIT SURGIFOAM (HEMOSTASIS) ×3 IMPLANT
KIT BASIN OR (CUSTOM PROCEDURE TRAY) ×3 IMPLANT
KIT ROOM TURNOVER OR (KITS) ×3 IMPLANT
NEEDLE HYPO 22GX1.5 SAFETY (NEEDLE) ×3 IMPLANT
NEEDLE SPNL 22GX3.5 QUINCKE BK (NEEDLE) ×3 IMPLANT
NS IRRIG 1000ML POUR BTL (IV SOLUTION) ×3 IMPLANT
OIL CARTRIDGE MAESTRO DRILL (MISCELLANEOUS) ×3
PACK LAMINECTOMY NEURO (CUSTOM PROCEDURE TRAY) ×3 IMPLANT
RUBBERBAND STERILE (MISCELLANEOUS) ×6 IMPLANT
SPONGE SURGIFOAM ABS GEL SZ50 (HEMOSTASIS) ×3 IMPLANT
STRIP CLOSURE SKIN 1/2X4 (GAUZE/BANDAGES/DRESSINGS) ×4 IMPLANT
SUT VIC AB 0 CT1 18XCR BRD8 (SUTURE) ×2 IMPLANT
SUT VIC AB 0 CT1 8-18 (SUTURE) ×4
SUT VIC AB 2-0 CT1 18 (SUTURE) ×3 IMPLANT
SUT VIC AB 4-0 PS2 27 (SUTURE) ×3 IMPLANT
TOWEL OR 17X24 6PK STRL BLUE (TOWEL DISPOSABLE) ×3 IMPLANT
TOWEL OR 17X26 10 PK STRL BLUE (TOWEL DISPOSABLE) ×3 IMPLANT
WATER STERILE IRR 1000ML POUR (IV SOLUTION) ×3 IMPLANT

## 2016-02-01 NOTE — H&P (Signed)
William Rich is an 76 y.o. male.   Chief Complaint: Back and leg pain and neurogenic claudication HPI: Patient is a pleasant 76 year old woman is a long-standing back pain with weakness numbness tingling in his legs and difficulty walking. 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 However due the patient's predominant symptoms of neurogenic claudication age and other medical comorbidities have recommended decompressive laminectomies only from L2-L5. I've extensively gone over the risks and benefits of that operation with him as well as perioperative course expectations of outcome and alternatives of surgery and he understands and agrees to proceed forward.  Past Medical History:  Diagnosis Date  . Allergy    takes Zyrtec and Singulair daily. Also uses Flonase daily  . Anxiety    takes Xanax daily  . Arthritis    spine & knees, ankle- R  . Asthma    as a child.Took shots   . Bruises easily   . Chronic back pain    stenosis  . Enlarged prostate   . GERD (gastroesophageal reflux disease)   . History of bronchitis as a child   . History of staph infection 2008  . Hypertension    takes Avalide daily  . Hypothyroidism    takes Synthroid daily  . Joint pain   . Nocturia   . Pneumonia    hx of-as a child  . PONV (postoperative nausea and vomiting)   . Thin skin   . Weakness    numbness and tingling in both feet.Takes Gabapentin daily    Past Surgical History:  Procedure Laterality Date  . BACK SURGERY  65/68/72  . CARPAL TUNNEL RELEASE Bilateral 2014  . cataract surgery Bilateral   . ESOPHAGOGASTRODUODENOSCOPY    . HERNIA REPAIR  66/76/89   inguinal hernias- x3   . JOINT REPLACEMENT Bilateral    knee   . SHOULDER ARTHROSCOPY Left 2008   x2 surgery- due to infection   . TONSILLECTOMY      History reviewed. No pertinent family history. Social History:  reports that he has  never smoked. He has never used smokeless tobacco. He reports that he does not drink alcohol or use drugs.  Allergies:  Allergies  Allergen Reactions  . Tape Other (See Comments)    TEARS SKIN SKIN REDNESS  . Augmentin [Amoxicillin-Pot Clavulanate] Diarrhea  . Avelox [Moxifloxacin] Nausea And Vomiting    Patient can tolerate ciprofloxacin.  . Cephalosporins Rash    Patient can tolerate Keflex  . Codeine Nausea And Vomiting  . Diclofenac Sodium Rash  . Doxycycline Nausea And Vomiting  . Etodolac Rash  . Maxipime [Cefepime] Rash  . Morphine Nausea And Vomiting  . Morphine And Related Nausea And Vomiting  . Sulfa Antibiotics Nausea And Vomiting  . Sulfasalazine Nausea And Vomiting  . Sulfur Nausea And Vomiting  . Voltaren [Diclofenac] Rash  . Zofran [Ondansetron Hcl] Nausea And Vomiting and Other (See Comments)    DYSPEPSIA    Medications Prior to Admission  Medication Sig Dispense Refill  . ALPRAZolam (XANAX) 0.5 MG tablet Take 0.5 mg by mouth every 8 (eight) hours.    . Carboxymethylcellulose Sodium (LUBRICANT EYE DROPS OP) Apply 1 drop to eye 2 (two) times daily as needed (dry eyes).    . cetirizine (ZYRTEC) 10 MG tablet Take 10 mg by mouth daily.    . famotidine (PEPCID) 20 MG tablet Take 20 mg by mouth 2 (two) times daily.    Marland Kitchen  fluticasone (FLONASE) 50 MCG/ACT nasal spray Place 1 spray into both nostrils daily.     Marland Kitchen. gabapentin (NEURONTIN) 100 MG capsule Take 200 mg by mouth every 8 (eight) hours.    Marland Kitchen. GLUCOSAMINE-CHONDROITIN PO Take 2 tablets by mouth 2 (two) times daily.    . irbesartan-hydrochlorothiazide (AVALIDE) 150-12.5 MG per tablet Take 0.5 tablets by mouth daily before breakfast.     . levalbuterol (XOPENEX HFA) 45 MCG/ACT inhaler Inhale 1-2 puffs into the lungs every 4 (four) hours as needed for wheezing or shortness of breath.    . levothyroxine (SYNTHROID, LEVOTHROID) 50 MCG tablet Take 50 mcg by mouth daily before breakfast.    . montelukast (SINGULAIR) 10 MG  tablet Take 10 mg by mouth at bedtime.    Marland Kitchen. OVER THE COUNTER MEDICATION Take 2 tablets by mouth daily. Over the counter "Prostate Revive" prostate supplement    . traMADol (ULTRAM) 50 MG tablet Take 50 mg by mouth every 8 (eight) hours.      No results found for this or any previous visit (from the past 48 hour(s)). No results found.  Review of Systems  Musculoskeletal: Positive for back pain, joint pain and myalgias.  Neurological: Positive for sensory change.    Blood pressure 138/84, pulse 77, temperature 98.8 F (37.1 C), temperature source Oral, resp. rate 20, weight 75.8 kg (167 lb), SpO2 99 %. Physical Exam  Constitutional: He is oriented to person, place, and time. He appears well-developed and well-nourished.  HENT:  Head: Normocephalic.  Eyes: Pupils are equal, round, and reactive to light.  Neck: Normal range of motion.  GI: Soft. Bowel sounds are normal.  Neurological: He is alert and oriented to person, place, and time. GCS eye subscore is 4. GCS verbal subscore is 5. GCS motor subscore is 6.  Strength is 5 out of 5 iliopsoas, quads, hip she's, gastric, into tibialis, and EHL.     Assessment/Plan 76 year old gentleman presents for decompressive lumbar laminectomies L1-L2 ,L2-3, L3-4, L4-5.  Casin William Rich P, MD 02/01/2016, 1:53 PM

## 2016-02-01 NOTE — Anesthesia Preprocedure Evaluation (Addendum)
Anesthesia Evaluation  Patient identified by MRN, date of birth, ID band Patient awake    Reviewed: Allergy & Precautions, NPO status , Patient's Chart, lab work & pertinent test results  Airway Mallampati: III  TM Distance: >3 FB Neck ROM: Limited    Dental  (+) Caps, Dental Advisory Given   Pulmonary asthma ,    Pulmonary exam normal breath sounds clear to auscultation       Cardiovascular hypertension, Normal cardiovascular exam Rhythm:Regular Rate:Normal     Neuro/Psych negative neurological ROS  negative psych ROS   GI/Hepatic Neg liver ROS, GERD  ,  Endo/Other  Hypothyroidism   Renal/GU negative Renal ROS  negative genitourinary   Musculoskeletal negative musculoskeletal ROS (+)   Abdominal   Peds negative pediatric ROS (+)  Hematology negative hematology ROS (+)   Anesthesia Other Findings   Reproductive/Obstetrics negative OB ROS                            Anesthesia Physical Anesthesia Plan  ASA: II  Anesthesia Plan: General   Post-op Pain Management:    Induction: Intravenous  Airway Management Planned: Oral ETT and Video Laryngoscope Planned  Additional Equipment:   Intra-op Plan:   Post-operative Plan: Extubation in OR  Informed Consent: I have reviewed the patients History and Physical, chart, labs and discussed the procedure including the risks, benefits and alternatives for the proposed anesthesia with the patient or authorized representative who has indicated his/her understanding and acceptance.   Dental advisory given  Plan Discussed with: CRNA and Surgeon  Anesthesia Plan Comments:        Anesthesia Quick Evaluation

## 2016-02-01 NOTE — Transfer of Care (Signed)
Immediate Anesthesia Transfer of Care Note  Patient: William Rich  Procedure(s) Performed: Procedure(s): LUMBAR TWO -FIVE LUMBAR LAMINECTOMY/DECOMPRESSION (N/A)  Patient Location: PACU  Anesthesia Type:General  Level of Consciousness: awake, alert  and oriented  Airway & Oxygen Therapy: Patient Spontanous Breathing and Patient connected to nasal cannula oxygen  Post-op Assessment: Report given to RN  Post vital signs: Reviewed and stable  Last Vitals:  Vitals:   02/01/16 1715 02/01/16 1724  BP:  127/72  Pulse:  73  Resp:  17  Temp: 36.4 C     Last Pain:  Vitals:   02/01/16 1715  TempSrc:   PainSc: 0-No pain         Complications: No apparent anesthesia complications

## 2016-02-01 NOTE — Op Note (Signed)
Preoperative diagnosis: Lumbar spinal stenosis and degenerative disc disease due to degenerative scoliosis L1-L5  Postoperative diagnosis: Same  Procedure: Decompressive lumbar laminectomy complete laminectomies of L2, L3, and L4 and partial laminectomy the superior aspect of L5 and inferior aspect of L1 decompressing the L1-2, L2-3,  L3-4, and L4-5 disc spaces. With foraminotomies of the L2, L3, L4, and L5 nerve roots bilaterally  Surgeon: Jillyn HiddenGary Farhad Burleson  Asst.: Marikay Alaravid Jones  Anesthesia: Gen.  EBL: Minimal  History of present illness: Patient is very pleasant 76 year old gentleman is a long-standing back pain and neurogenic claudication clinic it's over 100 feet or less. Patient failed all forms conservative workup revealed severe spinal stenosis at L12, L2-3, L3-4, L4-5. Due to patient's failure conservative treatment imaging findings and progressive clinical syndrome I recommended an present laminectomy at the levels. I extensively went over the risks and benefits of the operation the patient as well as perioperative course expectations of outcome and alternatives of surgery he understood and agreed to proceed forward.  Operative procedure: Patient brought into the or was induced on general anesthesia positioned prone the Wilson frame his back was prepped and draped in routine sterile fashion he had an old incision for laminotomy at L5-S1 so I started here and extended cephalad subperiosteal dissections care lamina of L1, L2, L3, L4, L5. Interoperative x-ray confirmed identification of the 34 disc space level the L4 pedicle. So at this point I removed the spinous process at L2-L3 and L4 and part of the superior aspect of the spinous process at L5 and part of the inferior aspect of spinous process of L1. Then I noted marked scoliotic deformity I drilled down the lamina initiate a laminotomy at L2-3 performed complete laminectomy of L2*worked inferiorly and a working from both inferior and superior  performed complete laminectomies of L L3, L4 partial of L5. 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 After teasing the dura and dissecting off of the large spur with a 4 Penfield I under bit the medial gutters marching down on both sides decompressing the central canal and performing foraminotomies at L2, L3, L4, and L5 bilaterally. At the end of decompression there was no further stenosis either centrally or foraminally at those levels I did extend laminotomy both superiorly and inferiorly until I encountered no further stenosis that included part of the inferior aspect of L1 and the superior aspect of L5 to get below that degenerative synovial cyst on the left at L4-5. The wound was copiously irrigated and meticulous in space was maintained Hemovac drain was placed and the wound was closed in layers with interrupted Vicryl fascia was injected with Marcaine subcutaneous tissues closed with 2-0 interrupted Vicryl skin was closed with a running 4-0 subcuticular. Dermabond benzo and Steri-Strips and sterile dressings applied patient recovered in stable condition. At the end the case all needle counts sponge counts were correct.

## 2016-02-01 NOTE — Anesthesia Procedure Notes (Signed)
Procedure Name: Intubation Date/Time: 02/01/2016 2:48 PM Performed by: Arta BruceSSEY, KEVIN Pre-anesthesia Checklist: Patient identified, Emergency Drugs available, Suction available and Patient being monitored Patient Re-evaluated:Patient Re-evaluated prior to inductionOxygen Delivery Method: Circle System Utilized Preoxygenation: Pre-oxygenation with 100% oxygen Intubation Type: IV induction Ventilation: Mask ventilation without difficulty Laryngoscope Size: Glidescope and 3 Grade View: Grade I Tube type: Oral Tube size: 7.5 mm Number of attempts: 1 Airway Equipment and Method: Stylet and Oral airway Placement Confirmation: ETT inserted through vocal cords under direct vision,  positive ETCO2 and breath sounds checked- equal and bilateral Secured at: 22 cm Tube secured with: Tape Dental Injury: Teeth and Oropharynx as per pre-operative assessment

## 2016-02-02 ENCOUNTER — Encounter (HOSPITAL_COMMUNITY): Payer: Self-pay | Admitting: Neurosurgery

## 2016-02-02 MED ORDER — PROMETHAZINE HCL 25 MG PO TABS: 25.0000 mg | ORAL_TABLET | Freq: Four times a day (QID) | ORAL | Status: DC | PRN

## 2016-02-02 NOTE — Evaluation (Signed)
Physical Therapy Evaluation Patient Details Name: William Rich MRN: [id number] DOB: 06-15-1940 Today's Date: 02/02/2016   History of Present Illness  Decompressive lumbar laminectomy complete laminectomies of L2, L3, and L4 and partial laminectomy the superior aspect of L5 and inferior aspect of L1 decompressing the L1-2, L2-3,  L3-4, and L4-5 disc spaces. With foraminotomies of the L2, L3, L4, and L5 nerve roots bilaterally  Clinical Impression  Pt admitted with above diagnosis. Pt currently with functional limitations due to the deficits listed below (see PT Problem List). At the time of PT eval pt was able to perform transfers and ambulation with min guard to supervision for safety. Would benefit from stair training again prior to d/c. Pt will benefit from skilled PT to increase their independence and safety with mobility to allow discharge to the venue listed below.       Follow Up Recommendations Outpatient PT    Equipment Recommendations  None recommended by PT    Recommendations for Other Services       Precautions / Restrictions Precautions Precautions: Back Restrictions Weight Bearing Restrictions: No      Mobility  Bed Mobility Overal bed mobility: Needs Assistance Bed Mobility: Rolling;Sidelying to Sit Rolling: Supervision Sidelying to sit: Supervision       General bed mobility comments: Pt was cued for log roll technqiue. HOB flat and rails used for support  Transfers Overall transfer level: Needs assistance Equipment used: Rolling walker (2 wheeled) Transfers: Sit to/from Stand Sit to Stand: Supervision         General transfer comment: Pt was cued for hand placement on seated surface for safety.   Ambulation/Gait Ambulation/Gait assistance: Supervision Ambulation Distance (Feet): 300 Feet Assistive device: Rolling walker (2 wheeled) Gait Pattern/deviations: Step-through pattern;Decreased stride length;Trunk flexed Gait velocity: Decreased Gait  velocity interpretation: Below normal speed for age/gender General Gait Details: Pt was able to ambulate without obvious LOB. Due to neck issues at baseline, was not able to improve posture and trunk was very flexed overall.   Stairs Stairs: Yes Stairs assistance: Min guard Stair Management: Two rails;Step to pattern;Forwards Number of Stairs: 2 General stair comments: VC's for sequencing and proper safety awareness.   Wheelchair Mobility    Modified Rankin (Stroke Patients Only)       Balance Overall balance assessment: Needs assistance Sitting-balance support: Feet supported;No upper extremity supported Sitting balance-Leahy Scale: Good     Standing balance support: No upper extremity supported;During functional activity Standing balance-Leahy Scale: Fair                               Pertinent Vitals/Pain Pain Assessment: 0-10 Pain Score: 5  Pain Location: back Pain Descriptors / Indicators: Aching;Discomfort;Grimacing Pain Intervention(s): Limited activity within patient's tolerance;Monitored during session;Repositioned    Home Living Family/patient expects to be discharged to:: Private residence Living Arrangements: Spouse/significant other Available Help at Discharge: Family;Available 24 hours/day Type of Home: House         Home Equipment: Shower seat - built in;Grab bars - tub/shower;Hand held shower head;Adaptive equipment      Prior Function Level of Independence: Independent               Hand Dominance   Dominant Hand: Right    Extremity/Trunk Assessment   Upper Extremity Assessment Upper Extremity Assessment: Defer to OT evaluation    Lower Extremity Assessment Lower Extremity Assessment: Generalized weakness (Previous knee replacements)    Cervical /  Trunk Assessment Cervical / Trunk Assessment: Kyphotic (Extreme forward head posture)  Communication   Communication: No difficulties  Cognition Arousal/Alertness:  Awake/alert Behavior During Therapy: WFL for tasks assessed/performed Overall Cognitive Status: Within Functional Limits for tasks assessed                      General Comments      Exercises     Assessment/Plan    PT Assessment Patient needs continued PT services  PT Problem List Decreased strength;Decreased range of motion;Decreased activity tolerance;Decreased balance;Decreased mobility;Decreased knowledge of use of DME;Decreased safety awareness;Decreased knowledge of precautions;Pain          PT Treatment Interventions DME instruction;Gait training;Stair training;Functional mobility training;Therapeutic activities;Therapeutic exercise;Neuromuscular re-education;Patient/family education    PT Goals (Current goals can be found in the Care Plan section)  Acute Rehab PT Goals Patient Stated Goal: to go home PT Goal Formulation: With patient/family Time For Goal Achievement: 02/09/16 Potential to Achieve Goals: Good    Frequency Min 5X/week   Barriers to discharge        Co-evaluation               End of Session Equipment Utilized During Treatment: Gait belt Activity Tolerance: Patient tolerated treatment well Patient left: in chair;with call bell/phone within reach;with family/visitor present Nurse Communication: Mobility status         Time: 0730-0750 PT Time Calculation (min) (ACUTE ONLY): 20 min   Charges:   PT Evaluation $PT Eval Moderate Complexity: 1 Procedure     PT G CodesMarylynn Pearson 02/06/16, 2:24 PM   Conni Slipper, PT, DPT Acute Rehabilitation Services Pager: [phone]

## 2016-02-02 NOTE — Progress Notes (Signed)
Subjective: Patient reports Doing well back pain no apparent  Objective: Vital signs in last 24 hours: Temp:  [97.6 F (36.4 C)-98.9 F (37.2 C)] 98.4 F (36.9 C) (01/04 0511) Pulse Rate:  [72-86] 73 (01/04 0511) Resp:  [16-20] 18 (01/04 0511) BP: (112-158)/(62-97) 112/62 (01/04 0511) SpO2:  [91 %-100 %] 98 % (01/04 0511) Weight:  [75.8 kg (167 lb)] 75.8 kg (167 lb) (01/03 1154)  Intake/Output from previous day: 01/03 0701 - 01/04 0700 In: 1740 [P.O.:240; I.V.:1500] Out: 440 [Drains:240; Blood:200] Intake/Output this shift: No intake/output data recorded.  Strength 5 wound clean dry and intact  Lab Results: No results for input(s): WBC, HGB, HCT, PLT in the last 72 hours. BMET No results for input(s): NA, K, CL, CO2, GLUCOSE, BUN, CREATININE, CALCIUM in the last 72 hours.  Studies/Results: Dg Lumbar Spine 1 View  Result Date: 02/01/2016 CLINICAL DATA:  Laminectomy L2 through L5 EXAM: LUMBAR SPINE - 1 VIEW COMPARISON:  Lumbar MRI 12/04/2015 FINDINGS: L5-S1 as lowest disc space as reported on the MRI. Surgical instrument is present over the spinal canal posterior to the L4 pedicle. IMPRESSION: L4 pedicle localized in the operating room. Electronically Signed   By: Marlan Palauharles  Clark M.D.   On: 02/01/2016 15:43    Assessment/Plan: Postoperative day 1 decompressive laminectomy L2 to L5 through fairly well signal improvement numbness in his legs and walking during a but still too high to discharge will keep another day continue to work with physical and occupational therapy.  LOS: 1 day     Carolin Quang P 02/02/2016, 7:40 AM

## 2016-02-02 NOTE — Anesthesia Postprocedure Evaluation (Signed)
Anesthesia Post Note  Patient: William ObeyDavid W Sherrow  Procedure(s) Performed: Procedure(s) (LRB): LUMBAR TWO -FIVE LUMBAR LAMINECTOMY/DECOMPRESSION (N/A)  Patient location during evaluation: PACU Anesthesia Type: General Level of consciousness: awake and alert Pain management: pain level controlled Vital Signs Assessment: post-procedure vital signs reviewed and stable Respiratory status: spontaneous breathing, nonlabored ventilation, respiratory function stable and patient connected to nasal cannula oxygen Cardiovascular status: blood pressure returned to baseline and stable Postop Assessment: no signs of nausea or vomiting Anesthetic complications: no       Last Vitals:  Vitals:   02/02/16 1133 02/02/16 1604  BP: 126/72 124/75  Pulse: 69 85  Resp: 16 16  Temp: 37.1 C 36.7 C    Last Pain:  Vitals:   02/02/16 0526  TempSrc:   PainSc: 4                  Donasia Wimes Yasuo

## 2016-02-02 NOTE — Progress Notes (Signed)
Occupational Therapy Evaluation Patient Details Name: William ObeyDavid W Fewell MRN: [id number] DOB: Aug 23, 1940 Today's Date: 02/02/2016    History of Present Illness Decompressive lumbar laminectomy complete laminectomies of L2, L3, and L4 and partial laminectomy the superior aspect of L5 and inferior aspect of L1 decompressing the L1-2, L2-3,  L3-4, and L4-5 disc spaces. With foraminotomies of the L2, L3, L4, and L5 nerve roots bilaterally   Clinical Impression   Pt making excellent progress. Completed education regarding ADL and functional mobility for ADL and back precautions. Wife present for session and verbalized understanding. No equipment needs. Pt safe to DC home with intermittent S when medically stable.     Follow Up Recommendations  No OT follow up;Supervision - Intermittent    Equipment Recommendations  None recommended by OT    Recommendations for Other Services       Precautions / Restrictions Precautions Precautions: Back      Mobility Bed Mobility               General bed mobility comments: reviewed proper bed mobility techniques  Transfers Overall transfer level: Needs assistance   Transfers: Sit to/from Stand Sit to Stand: Supervision              Balance Overall balance assessment: Needs assistance           Standing balance-Leahy Scale: Fair                              ADL Overall ADL's : Needs assistance/impaired                                     Functional mobility during ADLs: Supervision/safety General ADL Comments: Educated pt/wife on compensatory techniques for ADL and functional mobility for ADL. Wife present for education. Pt able to cross feet over knees to compelte LB ADL. Recommended pt use reacher to retrieve items form floor. Educated on reducing risk of falls. Pt verbalized understanding.      Vision     Perception     Praxis      Pertinent Vitals/Pain Pain Assessment: 0-10 Pain  Score: 5  Pain Location: back Pain Descriptors / Indicators: Aching;Discomfort;Grimacing Pain Intervention(s): Limited activity within patient's tolerance;Repositioned     Hand Dominance Right   Extremity/Trunk Assessment Upper Extremity Assessment Upper Extremity Assessment: Overall WFL for tasks assessed   Lower Extremity Assessment Lower Extremity Assessment: Defer to PT evaluation   Cervical / Trunk Assessment Cervical / Trunk Assessment: Kyphotic   Communication Communication Communication: No difficulties   Cognition Arousal/Alertness: Awake/alert Behavior During Therapy: WFL for tasks assessed/performed Overall Cognitive Status: Within Functional Limits for tasks assessed                     General Comments       Exercises       Shoulder Instructions      Home Living Family/patient expects to be discharged to:: Private residence Living Arrangements: Spouse/significant other Available Help at Discharge: Family;Available 24 hours/day Type of Home: House             Bathroom Shower/Tub: Tub/shower unit Shower/tub characteristics: Curtain FirefighterBathroom Toilet: Handicapped height Bathroom Accessibility: Yes How Accessible: Accessible via walker Home Equipment: Shower seat - built in;Grab bars - tub/shower;Hand held shower head;Adaptive equipment Adaptive Equipment: Reacher  Prior Functioning/Environment Level of Independence: Independent                 OT Problem List: Decreased strength;Decreased range of motion;Decreased activity tolerance;Impaired balance (sitting and/or standing);Decreased safety awareness;Decreased knowledge of use of DME or AE;Pain   OT Treatment/Interventions:      OT Goals(Current goals can be found in the care plan section) Acute Rehab OT Goals Patient Stated Goal: to go home OT Goal Formulation: All assessment and education complete, DC therapy  OT Frequency:     Barriers to D/C:             Co-evaluation              End of Session Nurse Communication: Mobility status  Activity Tolerance: Patient tolerated treatment well Patient left: in chair;with call bell/phone within reach;with family/visitor present   Time: 0812-0833 OT Time Calculation (min): 21 min Charges:  OT General Charges $OT Visit: 1 Procedure OT Evaluation $OT Eval Low Complexity: 1 Procedure G-Codes:    Therin Vetsch,HILLARY 02/19/16, 1:04 PM   Luisa Dago, OT/L  [phone] 2016-02-19

## 2016-02-03 MED ORDER — OXYCODONE-ACETAMINOPHEN 5-325 MG PO TABS: 1.0000 | ORAL_TABLET | ORAL | 0 refills | Status: DC | PRN

## 2016-02-03 NOTE — Progress Notes (Signed)
Patient alert and oriented, mae's well, voiding adequate amount of urine, swallowing without difficulty, no c/o pain at time of discharge. Patient discharged home with family. Script and discharged instructions given to patient. Patient and family stated understanding of instructions given. Patient has an appointment with Dr. Cram 

## 2016-02-03 NOTE — Discharge Summary (Signed)
  Physician Discharge Summary  Patient ID: William ObeyDavid W Rich MRN: [id number] DOB/AGE: 1940/08/24 76 y.o.  Admit date: 02/01/2016 Discharge date: 02/03/2016  Admission Diagnoses:R spinal stenosis L2-L5  Discharge Diagnoses: Same Active Problems:   Spinal stenosis of lumbar region   Discharged Condition: good  Hospital Course: Patient is medical Hospital underwent decompressive lumbar laminectomy L2-5. Postoperatively patient did very well recovered in the floor on the floor was angling and voiding spontaneously tolerating regular diet stable for discharge home.  Consults: Significant Diagnostic Studies: Treatments: L2-5 decompressive laminectomy strength strength 5 out of 5 Discharge Exam: Blood pressure 107/62, pulse (!) 52, temperature 97.7 F (36.5 C), temperature source Oral, resp. rate 18, weight 75.8 kg (167 lb), SpO2 98 %. Strength 5 out of 5  Disposition: Home   Allergies as of 02/03/2016      Reactions   Tape Other (See Comments)   TEARS SKIN SKIN REDNESS   Augmentin [amoxicillin-pot Clavulanate] Diarrhea   Avelox [moxifloxacin] Nausea And Vomiting   Patient can tolerate ciprofloxacin.   Cephalosporins Rash   Patient can tolerate Keflex   Codeine Nausea And Vomiting   Diclofenac Sodium Rash   Doxycycline Nausea And Vomiting   Etodolac Rash   Maxipime [cefepime] Rash   Morphine Nausea And Vomiting   Morphine And Related Nausea And Vomiting   Sulfa Antibiotics Nausea And Vomiting   Sulfasalazine Nausea And Vomiting   Sulfur Nausea And Vomiting   Voltaren [diclofenac] Rash   Zofran [ondansetron Hcl] Nausea And Vomiting, Other (See Comments)   DYSPEPSIA      Medication List    TAKE these medications   ALPRAZolam 0.5 MG tablet Commonly known as:  XANAX Take 0.5 mg by mouth every 8 (eight) hours.   cetirizine 10 MG tablet Commonly known as:  ZYRTEC Take 10 mg by mouth daily.   famotidine 20 MG tablet Commonly known as:  PEPCID Take 20 mg by mouth 2 (two)  times daily.   fluticasone 50 MCG/ACT nasal spray Commonly known as:  FLONASE Place 1 spray into both nostrils daily.   gabapentin 100 MG capsule Commonly known as:  NEURONTIN Take 200 mg by mouth every 8 (eight) hours.   GLUCOSAMINE-CHONDROITIN PO Take 2 tablets by mouth 2 (two) times daily.   irbesartan-hydrochlorothiazide 150-12.5 MG tablet Commonly known as:  AVALIDE Take 0.5 tablets by mouth daily before breakfast.   levalbuterol 45 MCG/ACT inhaler Commonly known as:  XOPENEX HFA Inhale 1-2 puffs into the lungs every 4 (four) hours as needed for wheezing or shortness of breath.   levothyroxine 50 MCG tablet Commonly known as:  SYNTHROID, LEVOTHROID Take 50 mcg by mouth daily before breakfast.   LUBRICANT EYE DROPS OP Apply 1 drop to eye 2 (two) times daily as needed (dry eyes).   montelukast 10 MG tablet Commonly known as:  SINGULAIR Take 10 mg by mouth at bedtime.   OVER THE COUNTER MEDICATION Take 2 tablets by mouth daily. Over the counter "Prostate Revive" prostate supplement   oxyCODONE-acetaminophen 5-325 MG tablet Commonly known as:  PERCOCET/ROXICET Take 1-2 tablets by mouth every 4 (four) hours as needed for moderate pain.   traMADol 50 MG tablet Commonly known as:  ULTRAM Take 50 mg by mouth every 8 (eight) hours.        Signed: Travaughn Vue P 02/03/2016, 7:29 AM

## 2016-02-03 NOTE — Progress Notes (Signed)
PT Cancellation Note  Patient Details Name: William ObeyDavid W Leap MRN: [id number] DOB: February 01, 1940   Cancelled Treatment:    Reason Eval/Treat Not Completed: Patient declined, no reason specified. Pt eating breakfast and declining participation in therapy session at this time. PT will continue to f/u with pt as available and appropriate. Per Nurse Tech, pt has been ambulating in hallways with wife multiple times this morning.   Alessandra BevelsJennifer M Dagmar Adcox 02/03/2016, 10:05 AM Deborah ChalkJennifer Nadyne Gariepy, PT, DPT [phone]

## 2016-02-03 NOTE — Discharge Instructions (Signed)

## 2018-04-16 IMAGING — CR DG LUMBAR SPINE 1V
1 series · 1 of 1 positions shown · non-contrast
Comparison: Lumbar MRI 12/04/2015

CLINICAL DATA: Laminectomy L2 through L5

EXAM:
LUMBAR SPINE - 1 VIEW

[lateral]
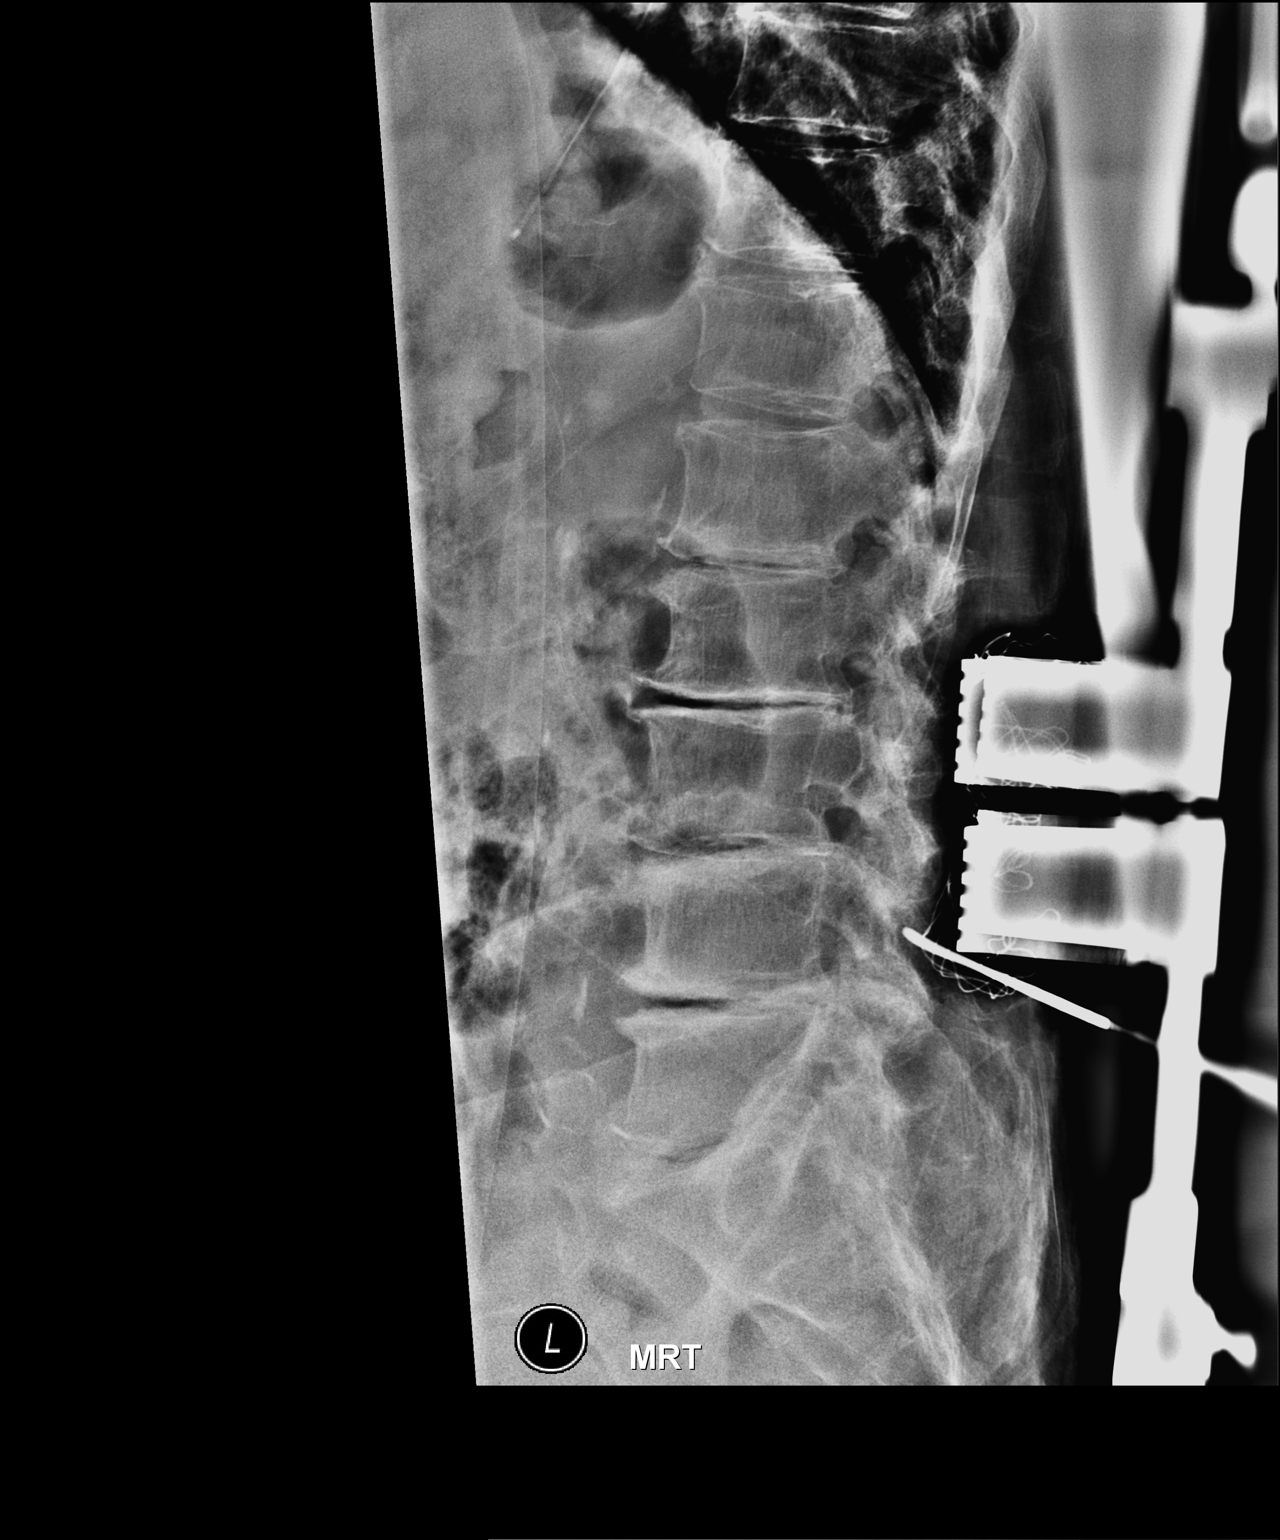

[1 of 1 positions shown; findings below may reference images not displayed]

FINDINGS: L5-S1 as lowest disc space as reported on the MRI.

Surgical instrument is present over the spinal canal posterior to
the L4 pedicle.
IMPRESSION: L4 pedicle localized in the operating room.

## 2022-10-30 DEATH — deceased
